# Patient Record
Sex: Female | Born: 1990 | Race: White | Hispanic: No | Marital: Single | Smoking: Never smoker
Health system: Southern US, Community
[De-identification: ages and names within clinical notes are randomized; demographics above are authoritative.]

---

## 2016-09-03 ENCOUNTER — Emergency Department (HOSPITAL_COMMUNITY): Payer: BLUE CROSS/BLUE SHIELD

## 2016-09-03 ENCOUNTER — Encounter (HOSPITAL_COMMUNITY): Payer: Self-pay | Admitting: Emergency Medicine

## 2016-09-03 ENCOUNTER — Emergency Department (HOSPITAL_COMMUNITY)
Admission: EM | Admit: 2016-09-03 | Discharge: 2016-09-03 | Disposition: A | Discharge status: Home / Self Care | Payer: BLUE CROSS/BLUE SHIELD | Attending: Emergency Medicine | Admitting: Emergency Medicine

## 2016-09-03 DIAGNOSIS — R002 Palpitations: Secondary | ICD-10-CM | POA: Diagnosis present

## 2016-09-03 DIAGNOSIS — Z79899 Other long term (current) drug therapy: Secondary | ICD-10-CM | POA: Insufficient documentation

## 2016-09-03 LAB — BASIC METABOLIC PANEL
Anion gap: 7 (ref 5–15)
BUN: 13 mg/dL (ref 6–20)
CALCIUM: 9.4 mg/dL (ref 8.9–10.3)
CO2: 26 mmol/L (ref 22–32)
Chloride: 105 mmol/L (ref 101–111)
Creatinine, Ser: 0.82 mg/dL (ref 0.44–1.00)
GFR calc Af Amer: >60 mL/min (ref >60)
GLUCOSE: 109 mg/dL — AB (ref 65–99)
Potassium: 3.9 mmol/L (ref 3.5–5.1)
Sodium: 138 mmol/L (ref 135–145)

## 2016-09-03 LAB — CBC
HCT: 40.2 % (ref 36.0–46.0)
Hemoglobin: 13.7 g/dL (ref 12.0–15.0)
MCH: 31.9 pg (ref 26.0–34.0)
MCHC: 34.1 g/dL (ref 30.0–36.0)
MCV: 93.7 fL (ref 78.0–100.0)
Platelets: 329 10*3/uL (ref 150–400)
RBC: 4.29 MIL/uL (ref 3.87–5.11)
RDW: 12.3 % (ref 11.5–15.5)
WBC: 9.8 10*3/uL (ref 4.0–10.5)

## 2016-09-03 LAB — I-STAT BETA HCG BLOOD, ED (MC, WL, AP ONLY): I-stat hCG, quantitative: <5 m[IU]/mL (ref <5)

## 2016-09-03 LAB — D-DIMER, QUANTITATIVE: D-Dimer, Quant: 0.4 ug/mL-FEU (ref 0.00–0.50)

## 2016-09-03 LAB — I-STAT TROPONIN, ED: Troponin i, poc: 0 ng/mL (ref 0.00–0.08)

## 2016-09-03 NOTE — ED Triage Notes (Addendum)
Pt c/o intermittent episodes of heart skipping beat accompanied by brief SOB and odd undescribable sensation to chest x years, worsened x past few hours with 10 episodes today. Left arm numbness in car 45 minutes ago. No pain or pressure. No increase in caffeine, stress. No dehydration. Takes oral contraceptives.

## 2016-09-03 NOTE — Discharge Instructions (Signed)
Please follow up with cardiology for further evaluation of symptoms. Recommend Holter monitoring. Increase fluid intake. Continue home medications as previously rx. Return to ED for chest pain, trouble breathing, leg swelling, coughing up blood or worsening of symptoms.

## 2016-09-03 NOTE — ED Provider Notes (Signed)
[facility]-EMERGENCY DEPT Provider Note   CSN: [id number] Arrival date & time: 09/03/16  1424     History   Chief Complaint Chief Complaint  Patient presents with  . Palpitations    HPI Andrea Cline is a 26 y.o. female.  Patient presents with intermittent palpitations that have occurred 10x today associated with a "weird feeling in chest," and some SOB. Denies chest pain. Describes it as "feels like my heart is skipping a beat or beating really fast." She has been experiencing these palpitations about 2-3x a week for the past few years but today they have been occurring more often. No particular activity brings them on and the sensation lasts a few seconds at a time. Does endorse some recent increased stress/anxiety related to her new job and coworkers. History positive for OCP use. This is the first time she is seeing a doctor for these sx. Denies hemoptysis, leg swelling, recent travel, recent surgery, prior DVT/PE. Family history positive for PVCs in her father that resolved by age 15 and negative for vascular disorders, clots or sudden cardiac death.       History reviewed. No pertinent past medical history.  There are no active problems to display for this patient.   History reviewed. No pertinent surgical history.  OB History    No data available       Home Medications    Prior to Admission medications   Medication Sig Start Date End Date Taking? Authorizing Provider  BLISOVI FE 1.5/30 1.5-30 MG-MCG tablet TK 1 T PO QD 08/17/16  Yes Historical Provider, MD  Multiple Vitamins-Minerals (MULTIVITAMIN ADULT) TABS Take 1 tablet by mouth daily.    Yes Historical Provider, MD    Family History No family history on file.  Social History Social History  Substance Use Topics  . Smoking status: Never Smoker  . Smokeless tobacco: Former Neurosurgeon  . Alcohol use 8.4 oz/week    14 Glasses of wine per week     Allergies   Penicillins and Amoxicillin   Review of  Systems Review of Systems  Constitutional: Negative for appetite change, chills and fever.  HENT: Negative for ear pain, rhinorrhea, sneezing and sore throat.   Eyes: Negative for photophobia and visual disturbance.  Respiratory: Positive for chest tightness. Negative for cough, shortness of breath and wheezing.   Cardiovascular: Positive for palpitations. Negative for chest pain and leg swelling.  Gastrointestinal: Negative for abdominal pain, blood in stool, constipation, diarrhea, nausea and vomiting.  Genitourinary: Negative for dysuria and vaginal bleeding.  Musculoskeletal: Negative for myalgias.  Skin: Negative for rash.  Neurological: Negative for dizziness, weakness and light-headedness.     Physical Exam Updated Vital Signs BP (!) 143/85   Pulse 85   Temp 98.5 F (36.9 C) (Oral)   Resp 19   LMP 08/13/2016   SpO2 99%   Physical Exam  Constitutional: She is oriented to person, place, and time. She appears well-developed and well-nourished. No distress.  HENT:  Head: Normocephalic and atraumatic.  Nose: Nose normal.  Eyes: Conjunctivae and EOM are normal. Pupils are equal, round, and reactive to light. Right eye exhibits no discharge. Left eye exhibits no discharge. No scleral icterus.  Neck: Normal range of motion. Neck supple.  Cardiovascular: Normal rate, regular rhythm, normal heart sounds and intact distal pulses.  Exam reveals no gallop and no friction rub.   No murmur heard. Pulmonary/Chest: Effort normal and breath sounds normal. No respiratory distress.  Abdominal: Soft. Bowel sounds are normal. She  exhibits no distension. There is no tenderness. There is no guarding.  Musculoskeletal: Normal range of motion. She exhibits no edema, tenderness or deformity.  Neurological: She is alert and oriented to person, place, and time. No sensory deficit. She exhibits normal muscle tone. Coordination normal.  Skin: Skin is warm and dry. No rash noted. She is not diaphoretic.   Psychiatric: She has a normal mood and affect.  Nursing note and vitals reviewed.    ED Treatments / Results  Labs (all labs ordered are listed, but only abnormal results are displayed) Labs Reviewed  BASIC METABOLIC PANEL - Abnormal; Notable for the following:       Result Value   Glucose, Bld 109 (*)    All other components within normal limits  CBC  D-DIMER, QUANTITATIVE (NOT AT [facility])  I-STAT TROPOININ, ED  I-STAT BETA HCG BLOOD, ED ([facility], [facility], [facility] ONLY)    EKG  EKG Interpretation  Date/Time:  Monday September 03 2016 14:36:54 EDT Ventricular Rate:  105 PR Interval:    QRS Duration: 89 QT Interval:  327 QTC Calculation: 433 R Axis:   62 Text Interpretation:  Sinus tachycardia Consider right atrial enlargement Borderline repolarization abnormality Baseline wander in lead(s) III aVL aVF No old tracing to compare Confirmed by ISAACS MD, CAMERON [phone]) on 09/03/2016 8:36:39 PM       Radiology Dg Chest 2 View  Result Date: 09/03/2016 CLINICAL DATA:  Intermittent episodes of heart skipping a beat, brief shortness of breath EXAM: CHEST  2 VIEW COMPARISON:  None. FINDINGS: The heart size and mediastinal contours are within normal limits. Both lungs are clear. The visualized skeletal structures are unremarkable. IMPRESSION: No active cardiopulmonary disease. Electronically Signed   By: Jasmine Pang M.D.   On: 09/03/2016 15:03    Procedures Procedures (including critical care time)  Medications Ordered in ED Medications - No data to display   Initial Impression / Assessment and Plan / ED Course  I have reviewed the triage vital signs and the nursing notes.  Pertinent labs & imaging results that were available during my care of the patient were reviewed by me and considered in my medical decision making (see chart for details).     Patient's history and symptoms concerning for arrhythmia vs. electrolyte imbalance vs. anxiety vs. PE vs. Less likely AMI. Patient's EKG showed  sinus tachycardia but evidence of SVT, WPW, LVH or brugada. Troponin negative. She was tachycardic to 120 in waiting room. CBC, BMP were normal and showed no anemia or electrolyte abnormalities. Because of her tachycardia and being on OCP, D-dimer was obtained and returned as normal. Her symptoms could be due to arrhythmia although not evident on today's EKG. There were PVCs on cardiac monitor intermittently. Could also be anxiety which she relates to her new job and coworkers. Patient was advised to follow up with cardiology for further evaluation and possible Holter monitoring. Information given. Encouraged to increase fluid intake. Return precautions were given.    Final Clinical Impressions(s) / ED Diagnoses   Final diagnoses:  Palpitations    New Prescriptions Discharge Medication List as of 09/03/2016  9:29 PM       Ethelreda Sukhu Deeann Dowse 09/03/16 2138    Shaune Pollack, MD 09/05/16 [phone]

## 2016-09-04 ENCOUNTER — Encounter: Payer: Self-pay | Admitting: Cardiovascular Disease

## 2016-09-04 NOTE — Progress Notes (Signed)
Cardiology Office Note   Date:  09/05/2016   ID:  [name], DOB 06/06/1990, MRN [id number]  PCP:  No PCP Per Patient  Cardiologist:   Charlton Haws, MD   No chief complaint on file.     History of Present Illness: Andrea Cline is a 26 y.o. female who presents for evaluation/consultation for palpitations.  Referred by DR Erma Heritage Seen in ER 09/03/16 Reviewed  Notes labs and ECG. Noted skips for a day. Some associated dyspnea. Has them 2-3x/week for the past few years. Only lasts a few seconds Some recent anxiety stress with new job. Father had PVCls that resolved by age 42 Non smoker has on average 2 glasses of wine / day ER Evaluation negative Telemetry with sinus tachycardia as high as 120 Occasional PVCls ECG with no high risk features no pre excitation Brugada Or SVT. D dimer negative Troponin negative Thyroid studies not done   She denies anxiety but sighs and takes deep breaths a lot during interview Not pregnant Has not had thyroid checked Denies drugs/ excess caffeine    No significant past medical history   No previous surgery   Current Outpatient Prescriptions  Medication Sig Dispense Refill  . BLISOVI FE 1.5/30 1.5-30 MG-MCG tablet Take 1 tablet by mouth daily  9  . Multiple Vitamins-Minerals (MULTIVITAMIN ADULT) TABS Take 1 tablet by mouth daily.      No current facility-administered medications for this visit.     Allergies:   Penicillins and Amoxicillin    Social History:  The patient  reports that she has never smoked. She has quit using smokeless tobacco. She reports that she drinks about 8.4 oz of alcohol per week . She reports that she does not use drugs. Went to school at [employer] live in [place] she is working for [employer] job [employer]   Family History:  Dad with PVCls seen at [facility] resolved by 18 Only child    ROS:  Please see the history of present illness.   Otherwise, review of systems are positive for none.   All other systems are  reviewed and negative.    PHYSICAL EXAM: VS:  BP (!) 142/100 (BP Location: Left Arm, Cuff Size: Small)   Pulse 83   Ht  (1.676 m)   Wt 156 lb 1.9 oz (70.8 kg)   LMP 08/13/2016   SpO2 99%   BMI 25.20 kg/m  , BMI Body mass index is 25.2 kg/m. Affect appropriate Healthy:  appears stated age HEENT: normal Neck supple with no adenopathy JVP normal no bruits no thyromegaly Lungs clear with no wheezing and good diaphragmatic motion Heart:  S1/S2 no murmur, no rub, gallop or click PMI normal Abdomen: benighn, BS positve, no tenderness, no AAA no bruit.  No HSM or HJR Distal pulses intact with no bruits No edema Neuro non-focal Skin warm and dry No muscular weakness    EKG:  SR no pre excitation Brugada or electrical abnormality   Recent Labs: 09/03/2016: BUN 13; Creatinine, Ser 0.82; Hemoglobin 13.7; Platelets 329; Potassium 3.9; Sodium 138    Lipid Panel No results found for: CHOL, TRIG, HDL, CHOLHDL, VLDL, LDLCALC, LDLDIRECT    Wt Readings from Last 3 Encounters:  09/05/16 156 lb 1.9 oz (70.8 kg)      Other studies Reviewed: Additional studies/ records that were reviewed today include: Notes, labs ECg from ER.    ASSESSMENT AND PLAN:  1.  Palpitations: start atenolol 25 mg f/u  event monitor and echo. Check TSH/T4 F/u with me after tests to reassess    Current medicines are reviewed at length with the patient today.  The patient does not have concerns regarding medicines.  The following changes have been made:  Atenolol 25 mg   Labs/ tests ordered today include: TSH/T4 No orders of the defined types were placed in this encounter.    Disposition:   FU with me next available      Signed, Charlton Haws, MD  09/05/2016 9:53 AM    [facility] [address] [place], [place], [place]  [address] Phone: [phone]; Fax: [phone]

## 2016-09-05 ENCOUNTER — Encounter: Payer: Self-pay | Admitting: Cardiovascular Disease

## 2016-09-05 ENCOUNTER — Ambulatory Visit (INDEPENDENT_AMBULATORY_CARE_PROVIDER_SITE_OTHER): Payer: BLUE CROSS/BLUE SHIELD | Admitting: Cardiovascular Disease

## 2016-09-05 VITALS — BP 142/100 | HR 83 | Ht 66.0 in | Wt 156.1 lb

## 2016-09-05 DIAGNOSIS — R002 Palpitations: Secondary | ICD-10-CM | POA: Diagnosis not present

## 2016-09-05 LAB — TSH: TSH: 2.61 u[IU]/mL (ref 0.450–4.500)

## 2016-09-05 LAB — T4, FREE: FREE T4: 1.13 ng/dL (ref 0.82–1.77)

## 2016-09-05 MED ORDER — ATENOLOL 25 MG PO TABS: 25.0000 mg | ORAL_TABLET | Freq: Every day | ORAL | 10 refills | Status: AC

## 2016-09-05 NOTE — Patient Instructions (Addendum)
Medication Instructions:  Your physician has recommended you make the following change in your medication:  1-Atenolol 25 mg by mouth daily  Labwork: Your physician recommends that you have lab work today TSH and T4  Testing/Procedures: Your physician has requested that you have an echocardiogram. Echocardiography is a painless test that uses sound waves to create images of your heart. It provides your doctor with information about the size and shape of your heart and how well your heart's chambers and valves are working. This procedure takes approximately one hour. There are no restrictions for this procedure.  Your physician has recommended that you wear an 30 day event monitor. Event monitors are medical devices that record the heart's electrical activity. Doctors most often [place] these monitors to diagnose arrhythmias. Arrhythmias are problems with the speed or rhythm of the heartbeat. The monitor is a small, portable device. You can wear one while you do your normal daily activities. This is usually used to diagnose what is causing palpitations/syncope (passing out).  Follow-Up: Your physician wants you to follow-up in:4 to 6 weeks after test are completed with Dr. Eden Emms.    If you need a refill on your cardiac medications before your next appointment, please call your pharmacy.   Atenolol tablets What is this medicine? ATENOLOL (a TEN oh lole) is a beta-blocker. Beta-blockers reduce the workload on the heart and help it to beat more regularly. This medicine is used to treat high blood pressure and to prevent chest pain. It is also used to protect the heart during a heart attack and to prevent an additional heart attack from occurring. This medicine may be used for other purposes; ask your health care provider or pharmacist if you have questions. COMMON BRAND NAME(S): Tenormin What should I tell my health care provider before I take this medicine? They need to know if you have any of  these conditions: -diabetes -heart or vessel disease like slow heart rate, worsening heart failure, heart block, sick sinus syndrome or Raynaud's disease -kidney disease -lung or breathing disease, like asthma or emphysema -pheochromocytoma -thyroid disease -an unusual or allergic reaction to atenolol, other beta-blockers, medicines, foods, dyes, or preservatives -pregnant or trying to get pregnant -breast-feeding How should I use this medicine? Take this medicine by mouth with a drink of water. Follow the directions on the prescription label. This medicine may be taken with or without food. Take your medicine at regular intervals. Do not take more medicine than directed. Do not stop taking this medicine suddenly. This could lead to serious heart-related effects. Talk to your pediatrician regarding the use of this medicine in children. Special care may be needed. Overdosage: If you think you have taken too much of this medicine contact a poison control center or emergency room at once. NOTE: This medicine is only for you. Do not share this medicine with others. What if I miss a dose? If you miss a dose, take it as soon as you can. If it is almost time for your next dose, take only that dose. Do not take double or extra doses. What may interact with this medicine? This medicine may interact with the following medications: -certain medicines for blood pressure, heart disease, irregular heart beat -clonidine -digoxin -diuretics -dobutamine -epinephrine -isoproterenol -NSAIDs, medicines for pain and inflammation, like ibuprofen or naproxen -reserpine This list may not describe all possible interactions. Give your health care provider a list of all the medicines, herbs, non-prescription drugs, or dietary supplements you use. Also tell them  if you smoke, drink alcohol, or use illegal drugs. Some items may interact with your medicine. What should I watch for while using this medicine? Visit  your doctor or health care professional for regular check ups. Check your blood pressure and pulse rate regularly. Ask your health care professional what your blood pressure and pulse rate should be, and when you should contact him or her. You may get drowsy or dizzy. Do not drive, use machinery, or do anything that needs mental alertness until you know how this medicine affects you. Do not stand or sit up quickly. Alcohol may interfere with the effect of this medicine. Avoid alcoholic drinks. This medicine can affect blood sugar levels. If you have diabetes, check with your doctor or health care professional before you change your diet or the dose of your diabetic medicine. Do not treat yourself for coughs, colds, or pain while you are taking this medicine without asking your doctor or health care professional for advice. Some ingredients may increase your blood pressure. What side effects may I notice from receiving this medicine? Side effects that you should report to your doctor or health care professional as soon as possible: -allergic reactions like skin rash, itching or hives, swelling of the face, lips, or tongue -breathing problems -changes in vision -chest pain -cold, tingling, or numb hands or feet -depression -fast, irregular heartbeat -feeling faint or lightheaded, falls -fever with sore throat -rapid weight gain -swollen ankles, legs Side effects that usually do not require medical attention (report to your doctor or health care professional if they continue or are bothersome): -anxiety, nervous -diarrhea -dry skin -change in sex drive or performance -headache -nightmares or trouble sleeping -short term memory loss -stomach upset -unusually tired This list may not describe all possible side effects. Call your doctor for medical advice about side effects. You may report side effects to FDA at 1-800-FDA-1088. Where should I keep my medicine? Keep out of the reach of  children. Store at room temperature between 20 and 25 degrees C (68 and 77 degrees F). Close tightly and protect from light. Throw away any unused medicine after the expiration date. NOTE: This sheet is a summary. It may not cover all possible information. If you have questions about this medicine, talk to your doctor, pharmacist, or health care provider.  2018 Elsevier/Gold Standard (2013-01-25 14:21:28)

## 2016-09-07 ENCOUNTER — Telehealth: Payer: Self-pay | Admitting: Cardiovascular Disease

## 2016-09-07 NOTE — Telephone Encounter (Signed)
New message    Pt is calling to find out about lab results from appt.

## 2016-09-07 NOTE — Telephone Encounter (Signed)
Patient aware of lab results. Per Dr. Eden Emms, thyroid is normal. Patient verbalized understanding.

## 2016-09-19 ENCOUNTER — Ambulatory Visit (INDEPENDENT_AMBULATORY_CARE_PROVIDER_SITE_OTHER): Payer: BLUE CROSS/BLUE SHIELD

## 2016-09-19 ENCOUNTER — Ambulatory Visit (HOSPITAL_COMMUNITY): Payer: BLUE CROSS/BLUE SHIELD | Attending: Cardiovascular Disease

## 2016-09-19 ENCOUNTER — Other Ambulatory Visit: Payer: Self-pay

## 2016-09-19 DIAGNOSIS — R002 Palpitations: Secondary | ICD-10-CM | POA: Insufficient documentation

## 2016-09-19 DIAGNOSIS — I361 Nonrheumatic tricuspid (valve) insufficiency: Secondary | ICD-10-CM | POA: Diagnosis not present

## 2016-10-04 ENCOUNTER — Telehealth: Payer: Self-pay | Admitting: *Deleted

## 2016-10-04 NOTE — Telephone Encounter (Signed)
PREVENTICE  SENT  CRITICAL  READING   ON PT , DR Eden EmmsNISHAN   REVIEWED  AND  REQUESTS PT  BE  SEEN   BY  EP  PT  AWARE ./CY WILL CALL PT  AND  MAKE  AN APPT./CY

## 2016-10-08 ENCOUNTER — Encounter: Payer: Self-pay | Admitting: Internal Medicine

## 2016-10-16 ENCOUNTER — Encounter: Payer: Self-pay | Admitting: Internal Medicine

## 2016-10-16 ENCOUNTER — Encounter (INDEPENDENT_AMBULATORY_CARE_PROVIDER_SITE_OTHER): Payer: Self-pay

## 2016-10-16 ENCOUNTER — Ambulatory Visit (INDEPENDENT_AMBULATORY_CARE_PROVIDER_SITE_OTHER): Payer: BLUE CROSS/BLUE SHIELD | Admitting: Internal Medicine

## 2016-10-16 VITALS — BP 126/90 | HR 67 | Ht 66.0 in | Wt 156.2 lb

## 2016-10-16 DIAGNOSIS — R002 Palpitations: Secondary | ICD-10-CM | POA: Diagnosis not present

## 2016-10-16 NOTE — Progress Notes (Signed)
      HPI [name] is referred today by Dr. Eden EmmsNishan for evaluation of palpitations and possible SVT. She is an otherwise healthy 26 yo woman with a h/o palpitations dating back to college. She would at times feel like her heart was racing but did not seek medical attention and the spells were short lived, lasting seconds. The patient had an episode a couple of weeks ago where she had an increased HR for over 90 seconds. She went to the urgent care and because of sob, told to go to the ED. By the time her ECG was obtained the racing had stopped and she was back to normal. She has worn a cardiac monitor and found to have NSR with what appears to be sinus tachy ( p waves are small) up to 155/min. Her ECG shows no pre-excitation. When she feels her heart racing she get sob but no syncope or chest pain.  Allergies  Allergen Reactions  . Penicillins     rash  . Amoxicillin Rash     Current Outpatient Prescriptions  Medication Sig Dispense Refill  . atenolol (TENORMIN) 25 MG tablet Take 1 tablet (25 mg total) by mouth daily. 30 tablet 10  . BLISOVI FE 1.5/30 1.5-30 MG-MCG tablet Take 1 tablet by mouth daily  9  . Multiple Vitamins-Minerals (MULTIVITAMIN ADULT) TABS Take 1 tablet by mouth daily.      No current facility-administered medications for this visit.      No past medical history on file.  ROS:   All systems reviewed and negative except as noted in the HPI.   No past surgical history on file.   No family history on file.   Social History   Social History  . Marital status: Single    Spouse name: N/A  . Number of children: N/A  . Years of education: N/A   Occupational History  . Not on file.   Social History Main Topics  . Smoking status: Never Smoker  . Smokeless tobacco: Former NeurosurgeonUser  . Alcohol use 8.4 oz/week    14 Glasses of wine per week  . Drug use: No  . Sexual activity: Not on file   Other Topics Concern  . Not on file   Social History Narrative  .  No narrative on file     BP 126/90   Pulse 67   Ht 5\' 6"  (1.676 m)   Wt 156 lb 3.2 oz (70.9 kg)   SpO2 99%   BMI 25.21 kg/m   Physical Exam:  Well appearing NAD HEENT: Unremarkable Neck:  No JVD, no thyromegally Lymphatics:  No adenopathy Back:  No CVA tenderness Lungs:  Clear HEART:  Regular rate rhythm, no murmurs, no rubs, no clicks Abd:  soft, positive bowel sounds, no organomegally, no rebound, no guarding Ext:  2 plus pulses, no edema, no cyanosis, no clubbing Skin:  No rashes no nodules Neuro:  CN II through XII intact, motor grossly intact  EKG - NSR with no pre-excitation.    Assess/Plan: 1. Palpitations - unclear if sinus tachy (likely) vs SVT. I have discussed the treatment options and recommended watchful waiting, avoidance of caffeine and ETOH, and asked her to return in several months. She appears to be tolerating the atenolol. 2. HTN - her blood pressure has been a little high and is normal on atenolol.  Andrea Cline,M.D.

## 2016-10-16 NOTE — Patient Instructions (Addendum)
Your physician recommends that you continue on your current medications as directed. Please refer to the Current Medication list given to you today.  Your physician wants you to follow-up in: 6 months with Dr. Taylor.  You will receive a reminder letter in the mail two months in advance. If you don't receive a letter, please call our office to schedule the follow-up appointment.  

## 2016-10-23 ENCOUNTER — Institutional Professional Consult (permissible substitution): Payer: BLUE CROSS/BLUE SHIELD | Admitting: Internal Medicine

## 2016-11-16 ENCOUNTER — Ambulatory Visit: Payer: BLUE CROSS/BLUE SHIELD | Admitting: Cardiovascular Disease

## 2018-05-09 IMAGING — CR DG CHEST 2V
2 series · 2 of 2 positions shown · non-contrast
Comparison: None.

CLINICAL DATA: Intermittent episodes of heart skipping a beat,
brief shortness of breath

EXAM:
CHEST  2 VIEW

[w chest pa]
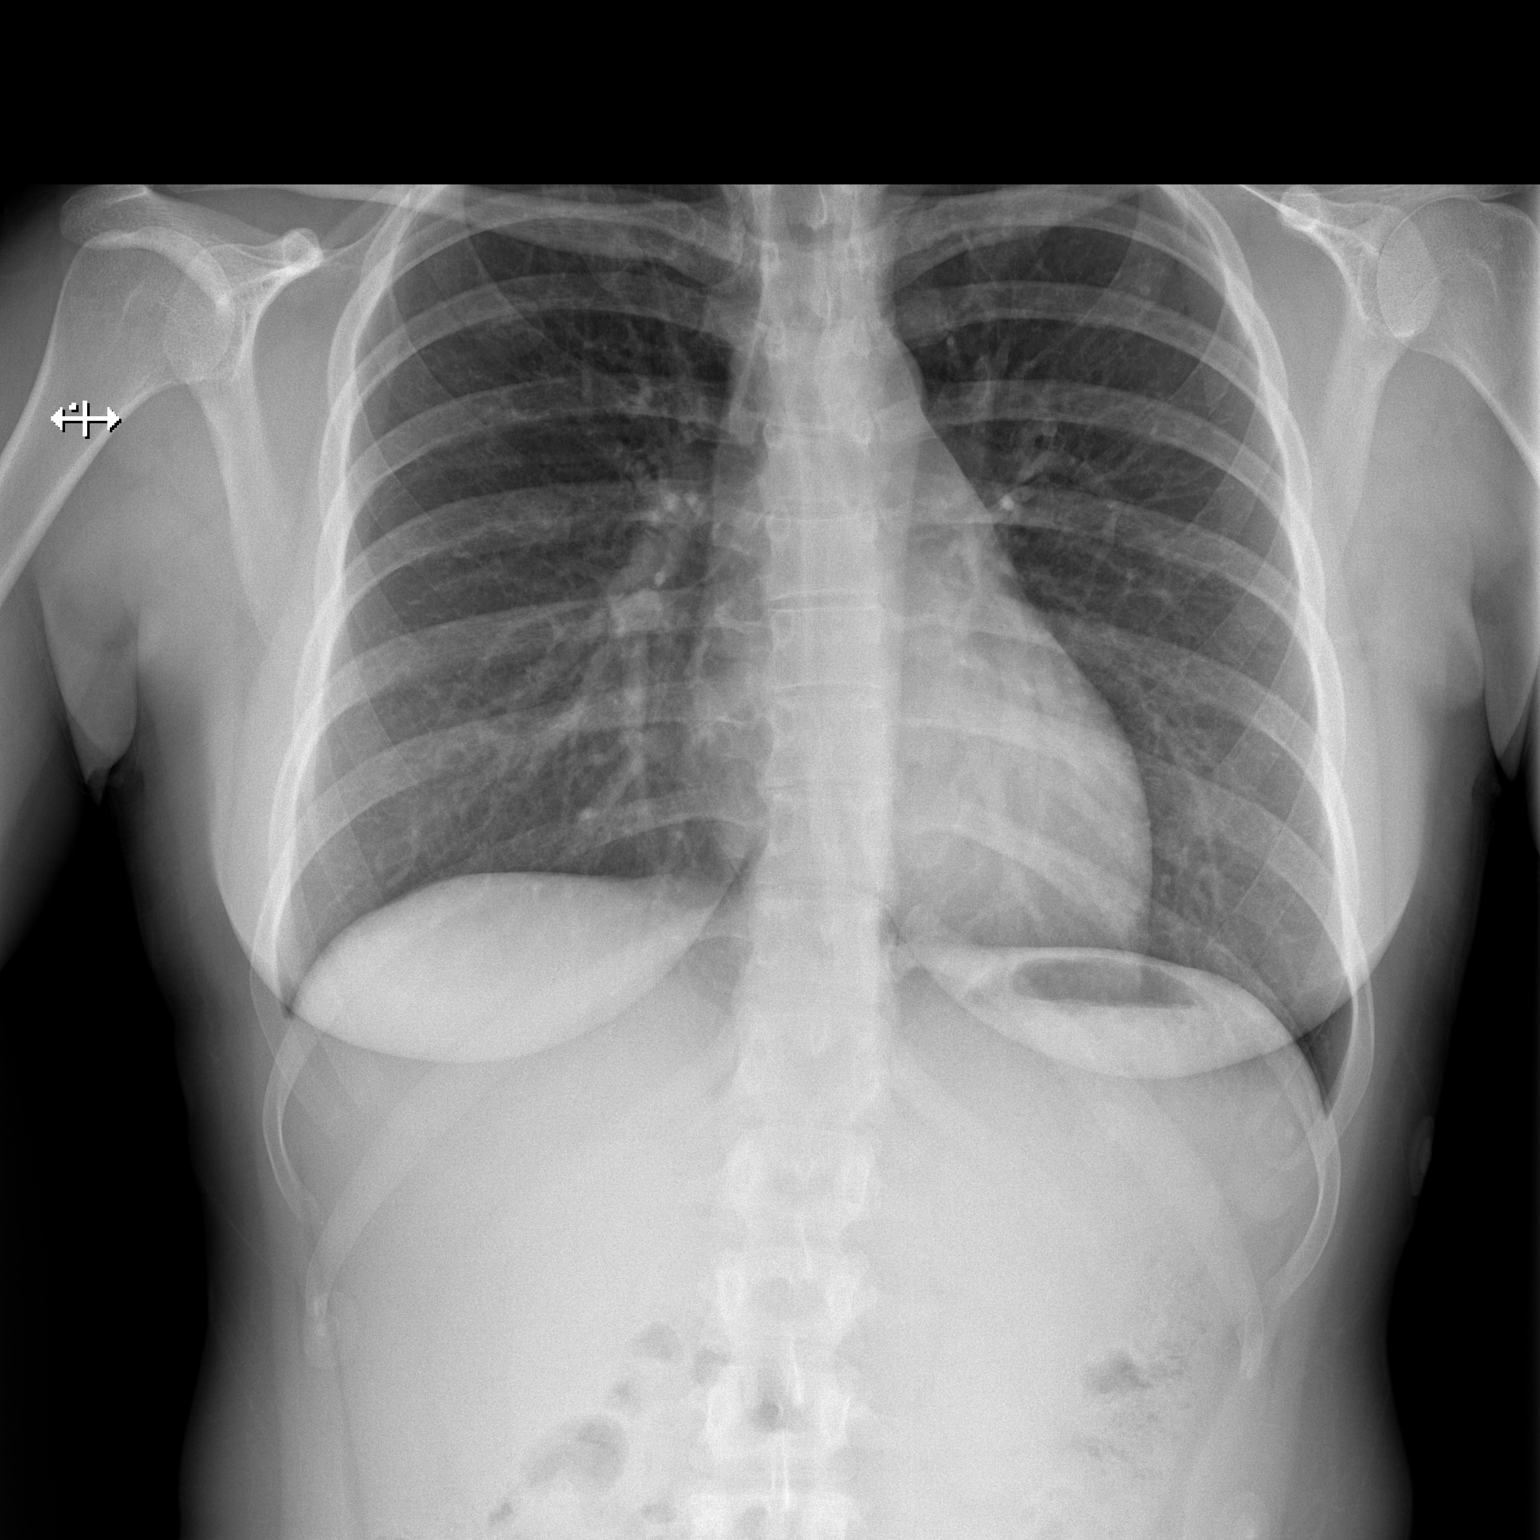

[w chest lat]
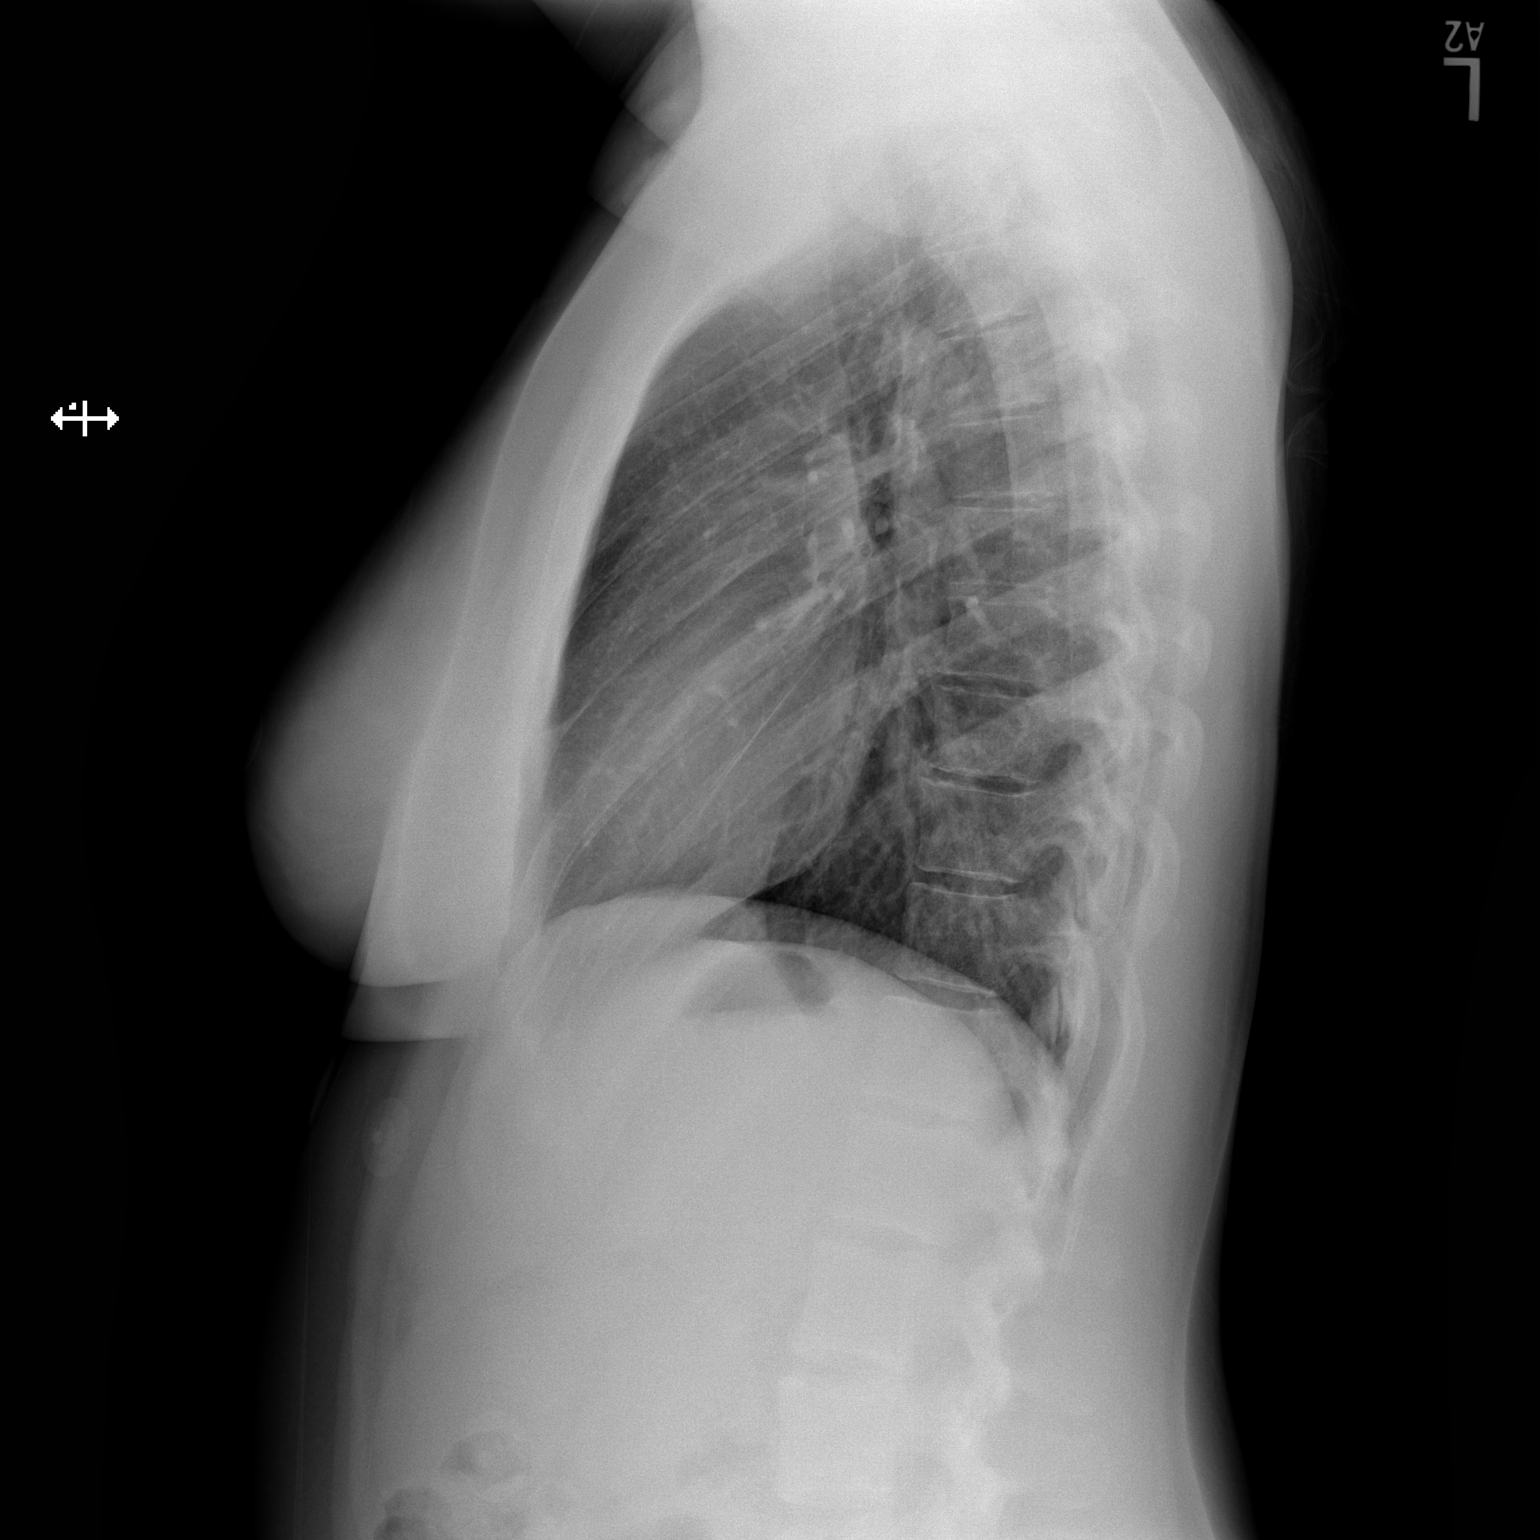

[2 of 2 positions shown; findings below may reference images not displayed]

FINDINGS: The heart size and mediastinal contours are within normal limits.
Both lungs are clear. The visualized skeletal structures are
unremarkable.
IMPRESSION: No active cardiopulmonary disease.

## 2022-05-02 ENCOUNTER — Other Ambulatory Visit: Payer: Self-pay

## 2022-05-02 NOTE — Telephone Encounter (Signed)
Error
